# Patient Record
Sex: Male | Born: 1965 | Race: Black or African American | Hispanic: No | Marital: Married | Smoking: Never smoker
Health system: Southern US, Community
[De-identification: ages and names within clinical notes are randomized; demographics above are authoritative.]

## PROBLEM LIST (undated history)

## (undated) DIAGNOSIS — Z86018 Personal history of other benign neoplasm: Secondary | ICD-10-CM

## (undated) DIAGNOSIS — R569 Unspecified convulsions: Secondary | ICD-10-CM

## (undated) DIAGNOSIS — I10 Essential (primary) hypertension: Secondary | ICD-10-CM

## (undated) DIAGNOSIS — E7800 Pure hypercholesterolemia, unspecified: Secondary | ICD-10-CM

## (undated) HISTORY — DX: Personal history of other benign neoplasm: Z86.018

## (undated) HISTORY — DX: Unspecified convulsions: R56.9

## (undated) HISTORY — DX: Pure hypercholesterolemia, unspecified: E78.00

---

## 2017-07-11 ENCOUNTER — Emergency Department (HOSPITAL_COMMUNITY): Payer: Self-pay

## 2017-07-11 ENCOUNTER — Encounter (HOSPITAL_COMMUNITY): Payer: Self-pay | Admitting: Emergency Medicine

## 2017-07-11 ENCOUNTER — Emergency Department (HOSPITAL_COMMUNITY)
Admission: EM | Admit: 2017-07-11 | Discharge: 2017-07-12 | Disposition: A | Discharge status: Home / Self Care | Payer: Self-pay | Attending: Emergency Medicine | Admitting: Emergency Medicine

## 2017-07-11 ENCOUNTER — Other Ambulatory Visit: Payer: Self-pay

## 2017-07-11 DIAGNOSIS — D329 Benign neoplasm of meninges, unspecified: Secondary | ICD-10-CM | POA: Insufficient documentation

## 2017-07-11 DIAGNOSIS — Z7982 Long term (current) use of aspirin: Secondary | ICD-10-CM | POA: Insufficient documentation

## 2017-07-11 DIAGNOSIS — I10 Essential (primary) hypertension: Secondary | ICD-10-CM | POA: Insufficient documentation

## 2017-07-11 DIAGNOSIS — Z79899 Other long term (current) drug therapy: Secondary | ICD-10-CM | POA: Insufficient documentation

## 2017-07-11 HISTORY — DX: Essential (primary) hypertension: I10

## 2017-07-11 LAB — CBG MONITORING, ED: GLUCOSE-CAPILLARY: 109 mg/dL — AB (ref 65–99)

## 2017-07-11 LAB — RAPID URINE DRUG SCREEN, HOSP PERFORMED
Amphetamines: NOT DETECTED
BARBITURATES: NOT DETECTED
Benzodiazepines: NOT DETECTED
Cocaine: NOT DETECTED
Opiates: NOT DETECTED
Tetrahydrocannabinol: NOT DETECTED

## 2017-07-11 LAB — CBC WITH DIFFERENTIAL/PLATELET
BASOS PCT: 1 %
Basophils Absolute: 0 10*3/uL (ref 0.0–0.1)
EOS ABS: 0.2 10*3/uL (ref 0.0–0.7)
Eosinophils Relative: 5 %
HCT: 41.5 % (ref 39.0–52.0)
HEMOGLOBIN: 13.7 g/dL (ref 13.0–17.0)
LYMPHS ABS: 1.7 10*3/uL (ref 0.7–4.0)
Lymphocytes Relative: 52 %
MCH: 28.2 pg (ref 26.0–34.0)
MCHC: 33 g/dL (ref 30.0–36.0)
MCV: 85.6 fL (ref 78.0–100.0)
Monocytes Absolute: 0.3 10*3/uL (ref 0.1–1.0)
Monocytes Relative: 10 %
NEUTROS PCT: 32 %
Neutro Abs: 1 10*3/uL — ABNORMAL LOW (ref 1.7–7.7)
Platelets: 149 10*3/uL — ABNORMAL LOW (ref 150–400)
RBC: 4.85 MIL/uL (ref 4.22–5.81)
RDW: 12.5 % (ref 11.5–15.5)
WBC: 3.2 10*3/uL — ABNORMAL LOW (ref 4.0–10.5)

## 2017-07-11 LAB — I-STAT CHEM 8, ED
BUN: 8 mg/dL (ref 6–20)
Calcium, Ion: 1.16 mmol/L (ref 1.15–1.40)
Chloride: 102 mmol/L (ref 101–111)
Creatinine, Ser: 0.7 mg/dL (ref 0.61–1.24)
Glucose, Bld: 88 mg/dL (ref 65–99)
HEMATOCRIT: 42 % (ref 39.0–52.0)
HEMOGLOBIN: 14.3 g/dL (ref 13.0–17.0)
Potassium: 3.6 mmol/L (ref 3.5–5.1)
SODIUM: 143 mmol/L (ref 135–145)
TCO2: 27 mmol/L (ref 22–32)

## 2017-07-11 LAB — COMPREHENSIVE METABOLIC PANEL
ALBUMIN: 4 g/dL (ref 3.5–5.0)
ALT: 17 U/L (ref 17–63)
ANION GAP: 9 (ref 5–15)
AST: 26 U/L (ref 15–41)
Alkaline Phosphatase: 32 U/L — ABNORMAL LOW (ref 38–126)
BILIRUBIN TOTAL: 0.7 mg/dL (ref 0.3–1.2)
BUN: 8 mg/dL (ref 6–20)
CO2: 27 mmol/L (ref 22–32)
Calcium: 9.5 mg/dL (ref 8.9–10.3)
Chloride: 102 mmol/L (ref 101–111)
Creatinine, Ser: 0.68 mg/dL (ref 0.61–1.24)
GFR calc Af Amer: >60 mL/min (ref >60)
GFR calc non Af Amer: >60 mL/min (ref >60)
GLUCOSE: 91 mg/dL (ref 65–99)
POTASSIUM: 3.5 mmol/L (ref 3.5–5.1)
SODIUM: 138 mmol/L (ref 135–145)
TOTAL PROTEIN: 6.7 g/dL (ref 6.5–8.1)

## 2017-07-11 LAB — SALICYLATE LEVEL: Salicylate Lvl: <7 mg/dL (ref 2.8–30.0)

## 2017-07-11 LAB — I-STAT CG4 LACTIC ACID, ED
LACTIC ACID, VENOUS: 1.14 mmol/L (ref 0.5–1.9)
Lactic Acid, Venous: 2.16 mmol/L (ref 0.5–1.9)

## 2017-07-11 LAB — ETHANOL

## 2017-07-11 LAB — TYPE AND SCREEN
ABO/RH(D): A POS
ANTIBODY SCREEN: NEGATIVE

## 2017-07-11 LAB — ACETAMINOPHEN LEVEL: Acetaminophen (Tylenol), Serum: <10 ug/mL — ABNORMAL LOW (ref 10–30)

## 2017-07-11 LAB — LIPASE, BLOOD: Lipase: 34 U/L (ref 11–51)

## 2017-07-11 LAB — I-STAT TROPONIN, ED: TROPONIN I, POC: 0 ng/mL (ref 0.00–0.08)

## 2017-07-11 LAB — ABO/RH: ABO/RH(D): A POS

## 2017-07-11 MED ORDER — IOPAMIDOL (ISOVUE-300) INJECTION 61%
INTRAVENOUS | Status: AC
  Administered 2017-07-11: 100 mL via INTRAVENOUS
  Filled 2017-07-11: qty 100

## 2017-07-11 MED ORDER — FENTANYL CITRATE (PF) 100 MCG/2ML IJ SOLN
50.0000 ug | Freq: Once | INTRAMUSCULAR | Status: AC
  Administered 2017-07-11: 50 ug via INTRAVENOUS
  Filled 2017-07-11: qty 2

## 2017-07-11 MED ORDER — SODIUM CHLORIDE 0.9 % IV BOLUS (SEPSIS)
1000.0000 mL | Freq: Once | INTRAVENOUS | Status: AC
  Administered 2017-07-11: 1000 mL via INTRAVENOUS

## 2017-07-11 MED ORDER — GADOBENATE DIMEGLUMINE 529 MG/ML IV SOLN
13.0000 mL | Freq: Once | INTRAVENOUS | Status: AC | PRN
  Administered 2017-07-11: 13 mL via INTRAVENOUS
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
## 2017-07-11 NOTE — ED Notes (Signed)
CBG: 109. RN notified.

## 2017-07-11 NOTE — ED Notes (Signed)
Returned from MRI 

## 2017-07-11 NOTE — ED Notes (Signed)
Patient transported to MRI 
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
## 2018-01-01 ENCOUNTER — Other Ambulatory Visit (HOSPITAL_COMMUNITY): Payer: Self-pay | Admitting: Neurosurgery

## 2018-01-01 DIAGNOSIS — D329 Benign neoplasm of meninges, unspecified: Secondary | ICD-10-CM

## 2018-01-18 ENCOUNTER — Ambulatory Visit (HOSPITAL_COMMUNITY): Payer: Self-pay

## 2018-02-11 DIAGNOSIS — Z0289 Encounter for other administrative examinations: Secondary | ICD-10-CM

## 2018-02-12 ENCOUNTER — Emergency Department (HOSPITAL_COMMUNITY)
Admission: EM | Admit: 2018-02-12 | Discharge: 2018-02-12 | Disposition: A | Discharge status: Home / Self Care | Payer: 59 | Attending: Emergency Medicine | Admitting: Emergency Medicine

## 2018-02-12 ENCOUNTER — Other Ambulatory Visit: Payer: Self-pay

## 2018-02-12 ENCOUNTER — Encounter (HOSPITAL_COMMUNITY): Payer: Self-pay

## 2018-02-12 ENCOUNTER — Emergency Department (HOSPITAL_COMMUNITY): Payer: 59

## 2018-02-12 DIAGNOSIS — F0390 Unspecified dementia without behavioral disturbance: Secondary | ICD-10-CM | POA: Diagnosis not present

## 2018-02-12 DIAGNOSIS — R569 Unspecified convulsions: Secondary | ICD-10-CM

## 2018-02-12 DIAGNOSIS — I10 Essential (primary) hypertension: Secondary | ICD-10-CM | POA: Insufficient documentation

## 2018-02-12 DIAGNOSIS — Z79899 Other long term (current) drug therapy: Secondary | ICD-10-CM | POA: Insufficient documentation

## 2018-02-12 HISTORY — DX: Unspecified convulsions: R56.9

## 2018-02-12 LAB — BASIC METABOLIC PANEL
Anion gap: 9 (ref 5–15)
BUN: 7 mg/dL (ref 6–20)
CHLORIDE: 108 mmol/L (ref 98–111)
CO2: 24 mmol/L (ref 22–32)
Calcium: 9.1 mg/dL (ref 8.9–10.3)
Creatinine, Ser: 0.88 mg/dL (ref 0.61–1.24)
GFR calc Af Amer: >60 mL/min (ref >60)
Glucose, Bld: 107 mg/dL — ABNORMAL HIGH (ref 70–99)
POTASSIUM: 4.2 mmol/L (ref 3.5–5.1)
Sodium: 141 mmol/L (ref 135–145)

## 2018-02-12 LAB — CBC
HCT: 42.2 % (ref 39.0–52.0)
HEMOGLOBIN: 13.8 g/dL (ref 13.0–17.0)
MCH: 29.2 pg (ref 26.0–34.0)
MCHC: 32.7 g/dL (ref 30.0–36.0)
MCV: 89.2 fL (ref 78.0–100.0)
Platelets: 161 10*3/uL (ref 150–400)
RBC: 4.73 MIL/uL (ref 4.22–5.81)
RDW: 12.6 % (ref 11.5–15.5)
WBC: 4 10*3/uL (ref 4.0–10.5)

## 2018-02-12 LAB — CBG MONITORING, ED: GLUCOSE-CAPILLARY: 111 mg/dL — AB (ref 70–99)

## 2018-02-12 MED ORDER — LEVETIRACETAM 500 MG PO TABS: 500.0000 mg | ORAL_TABLET | Freq: Two times a day (BID) | ORAL | 1 refills | Status: DC

## 2018-02-12 MED ORDER — PROCHLORPERAZINE EDISYLATE 10 MG/2ML IJ SOLN
10.0000 mg | Freq: Once | INTRAMUSCULAR | Status: AC
Start: 2018-02-12 — End: 2018-02-12
  Administered 2018-02-12: 10 mg via INTRAVENOUS
  Filled 2018-02-12: qty 2

## 2018-02-12 MED ORDER — KETOROLAC TROMETHAMINE 30 MG/ML IJ SOLN
30.0000 mg | Freq: Once | INTRAMUSCULAR | Status: AC
  Administered 2018-02-12: 30 mg via INTRAVENOUS
  Filled 2018-02-12: qty 1

## 2018-02-12 MED ORDER — LEVETIRACETAM IN NACL 1000 MG/100ML IV SOLN
1000.0000 mg | Freq: Once | INTRAVENOUS | Status: AC
  Administered 2018-02-12: 1000 mg via INTRAVENOUS
  Filled 2018-02-12: qty 100

## 2018-02-12 MED ORDER — LORAZEPAM 2 MG/ML IJ SOLN
1.0000 mg | Freq: Once | INTRAMUSCULAR | Status: AC
  Administered 2018-02-12: 1 mg via INTRAVENOUS

## 2018-02-12 MED ORDER — LORAZEPAM 2 MG/ML IJ SOLN
INTRAMUSCULAR | Status: AC
  Filled 2018-02-12: qty 1

## 2018-02-12 NOTE — ED Triage Notes (Signed)
Pt BIB [facility] d/t sz activity. Pt's wife awaken to Pt actively sz in bed. Pt was posturing w/ clinched teeth. Pt activity Pt waS disoriented  & lethargic. Pt has Hx. HTN

## 2018-02-12 NOTE — ED Provider Notes (Signed)
  Physical Exam  BP 101/71   Pulse 87   Temp 97.8 F (36.6 C) (Oral)   Resp 16   Ht 5\' 9"  (1.753 m)   SpO2 98%   BMI 22.15 kg/m   Physical Exam  ED Course/Procedures     Procedures  MDM  Patient with new onset seizure.  Known meningioma stable on CT.  Back near baseline.  Will discharge home.  Neurology follow-up.  No driving.       Davonna Belling, MD 02/12/18 [phone]

## 2018-02-12 NOTE — ED Provider Notes (Signed)
[facility] EMERGENCY DEPARTMENT Provider Note   CSN: [id number] Arrival date & time: 02/12/18  0401     History   Chief Complaint Chief Complaint  Patient presents with  . Seizures   Level 5 caveat: Seizure/postictal   HPI Timothy Conrad is a 52 y.o. male.  HPI Patient is a 52 year old male with history of hypertension was brought to the emergency department after his wife was awoken as he was having a seizure in the bed.  No prior history of seizures.  He does have a history of known meningioma.  This is been determined by neurosurgical to be nonoperative at this point.  He has been eating and drinking normally per the wife and has had no recent illness or fevers.  On arrival the emergency department he has had a recurrent second seizure.  No other history is able to be obtained at this time   Past Medical History:  Diagnosis Date  . High blood pressure     There are no active problems to display for this patient.   History reviewed. No pertinent surgical history.      Home Medications    Prior to Admission medications   Medication Sig Start Date End Date Taking? Authorizing Provider  Multiple Vitamin (MULTIVITAMIN WITH MINERALS) TABS tablet Take 1 tablet by mouth daily.   Yes [provider]  levETIRAcetam (KEPPRA) 500 MG tablet Take 1 tablet (500 mg total) by mouth 2 (two) times daily. 02/12/18   Jola Schmidt, MD    Family History History reviewed. No pertinent family history.  Social History Social History   Tobacco Use  . Smoking status: Never Smoker  . Smokeless tobacco: Never Used  Substance Use Topics  . Alcohol use: No    Frequency: Never  . Drug use: No     Allergies   Patient has no known allergies.   Review of Systems Review of Systems  Unable to perform ROS: Dementia     Physical Exam Updated Vital Signs BP 107/74   Pulse 97   Temp 97.8 F (36.6 C) (Oral)   Resp 15   Ht 5\' 9"  (1.753 m)   SpO2  98%   BMI 22.15 kg/m   Physical Exam  Constitutional: He appears well-developed and well-nourished.  HENT:  Head: Normocephalic and atraumatic.  Eyes: Pupils are equal, round, and reactive to light. EOM are normal.  Neck: Neck supple.  Cardiovascular: Regular rhythm.  Pulmonary/Chest: Effort normal and breath sounds normal. No respiratory distress.  Abdominal: Soft. He exhibits no distension. There is no tenderness.  Musculoskeletal:  Moves all 4 extremities  Neurological:  Opens eyes to pain  Skin: Skin is warm and dry.  Nursing note and vitals reviewed.    ED Treatments / Results  Labs (all labs ordered are listed, but only abnormal results are displayed) Labs Reviewed  CBG MONITORING, ED - Abnormal; Notable for the following components:      Result Value   Glucose-Capillary 111 (*)    All other components within normal limits  CBC  BASIC METABOLIC PANEL    EKG None  Radiology Ct Head Wo Contrast  Result Date: 02/12/2018 CLINICAL DATA:  Acute onset of seizures. EXAM: CT HEAD WITHOUT CONTRAST TECHNIQUE: Contiguous axial images were obtained from the base of the skull through the vertex without intravenous contrast. COMPARISON:  MRI of the brain and CT of the head performed 07/11/2017 FINDINGS: Brain: A 1.7 cm calcified meningioma is again noted at the  left anterior parafalcine region, with overlying edema, relatively stable from the prior study. No evidence of acute infarction, hemorrhage, hydrocephalus, or extra-axial collection. The posterior fossa, including the cerebellum, brainstem and fourth ventricle, is within normal limits. The third and lateral ventricles, and basal ganglia are unremarkable in appearance. The cerebral hemispheres are symmetric in appearance, with normal gray-white differentiation. No midline shift is seen. Vascular: No hyperdense vessel or unexpected calcification. Skull: There is no evidence of fracture; visualized osseous structures are  unremarkable in appearance. Sinuses/Orbits: The visualized portions of the orbits are within normal limits. The paranasal sinuses and mastoid air cells are well-aerated. Other: No significant soft tissue abnormalities are seen. IMPRESSION: 1. No acute intracranial pathology seen on CT. 2. 1.7 cm calcified meningioma again noted at the left anterior parafalcine region, with overlying edema, relatively stable from the prior study. Electronically Signed   By: Garald Balding M.D.   On: 02/12/2018 04:52    Procedures Procedures (including critical care time)  Medications Ordered in ED Medications  levETIRAcetam (KEPPRA) IVPB 1000 mg/100 mL premix (0 mg Intravenous Stopped 02/12/18 0548)  LORazepam (ATIVAN) injection 1 mg (1 mg Intravenous Given 02/12/18 0417)  prochlorperazine (COMPAZINE) injection 10 mg (10 mg Intravenous Given 02/12/18 0624)  ketorolac (TORADOL) 30 MG/ML injection 30 mg (30 mg Intravenous Given 02/12/18 0370)     Initial Impression / Assessment and Plan / ED Course  I have reviewed the triage vital signs and the nursing notes.  Pertinent labs & imaging results that were available during my care of the patient were reviewed by me and considered in my medical decision making (see chart for details).     Patient loaded with Keppra here in the emergency department.  He received a dose of Ativan to stop his seizure.  Head CT demonstrates no acute intracranial abnormality.  Known meningioma is similar in size.  Family updated.  Patient still postictal.  7:09 AM Patient opens eyes to voice and pain.  Rolls over and goes back to sleep.  Likely still reactive to Ativan.  Patient will need reevaluation to make sure he returns to baseline mental status.  As long as he returned to baseline mental status he will be stable for discharge with outpatient neurology follow-up.  Given his seizure x2 adenitis and has had a restarted on Keppra. 7:47 AM Care to Dr Alvino Chapel  Final Clinical  Impressions(s) / ED Diagnoses   Final diagnoses:  Seizure [facility])    ED Discharge Orders         Ordered    levETIRAcetam (KEPPRA) 500 MG tablet  2 times daily,   Status:  Discontinued     02/12/18 0705    levETIRAcetam (KEPPRA) 500 MG tablet  2 times daily     02/12/18 0705           Jola Schmidt, MD 02/12/18 [id number]

## 2018-02-17 ENCOUNTER — Ambulatory Visit (INDEPENDENT_AMBULATORY_CARE_PROVIDER_SITE_OTHER): Payer: 59 | Admitting: Diagnostic Neuroimaging

## 2018-02-17 ENCOUNTER — Encounter: Payer: Self-pay | Admitting: Diagnostic Neuroimaging

## 2018-02-17 ENCOUNTER — Encounter: Payer: Self-pay | Admitting: *Deleted

## 2018-02-17 VITALS — BP 129/79 | HR 89 | Ht 68.0 in | Wt 150.2 lb

## 2018-02-17 DIAGNOSIS — D329 Benign neoplasm of meninges, unspecified: Secondary | ICD-10-CM

## 2018-02-17 DIAGNOSIS — G40909 Epilepsy, unspecified, not intractable, without status epilepticus: Secondary | ICD-10-CM | POA: Insufficient documentation

## 2018-02-17 MED ORDER — LEVETIRACETAM 500 MG PO TABS: 500.0000 mg | ORAL_TABLET | Freq: Two times a day (BID) | ORAL | 4 refills | Status: DC
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
## 2018-02-17 NOTE — Patient Instructions (Signed)
SEIZURE DISORDER  - continue levetiracetam 500mg  twice a day  - check EEG  - follow up with neurosurgery clinic for treatment of meningioma (likely symptomatic)   - According to [place] law, you can not drive unless you are seizure / syncope free for at least 6 months and under physician's care.   - Please maintain precautions. Do not participate in activities where a loss of awareness could harm you or someone else. No swimming alone, no tub bathing, no hot tubs, no driving, no operating motorized vehicles (cars, ATVs, motocycles, etc), lawnmowers, power tools or firearms. No standing at heights, such as rooftops, ladders or stairs. Avoid hot objects such as stoves, heaters, open fires. Wear a helmet when riding a bicycle, scooter, skateboard, etc. and avoid areas of traffic. Set your water heater to 120 degrees or less.

## 2018-02-19 ENCOUNTER — Telehealth: Payer: Self-pay | Admitting: *Deleted

## 2018-02-19 NOTE — Telephone Encounter (Signed)
Received short term disability forms from [place]. Form placed on Dr [employer] for review, completion, signature.

## 2018-02-22 NOTE — Telephone Encounter (Signed)
[employer] short term disability forms completed, sent to MR with copy of last office note to be processed.

## 2018-02-23 ENCOUNTER — Telehealth: Payer: Self-pay | Admitting: *Deleted

## 2018-02-23 NOTE — Telephone Encounter (Signed)
I faxed pt Timothy Conrad group form to [phone]

## 2018-02-25 ENCOUNTER — Ambulatory Visit (INDEPENDENT_AMBULATORY_CARE_PROVIDER_SITE_OTHER): Payer: 59 | Admitting: Diagnostic Neuroimaging

## 2018-02-25 DIAGNOSIS — G40909 Epilepsy, unspecified, not intractable, without status epilepticus: Secondary | ICD-10-CM | POA: Diagnosis not present

## 2018-03-02 DIAGNOSIS — Z0289 Encounter for other administrative examinations: Secondary | ICD-10-CM

## 2018-03-02 NOTE — Telephone Encounter (Signed)
Received second request for [employer] forms and office notes. They were faxed on 02/23/18 per D Settle, MR. Re faxed previous forms and office note again to [employer]. Received confirmation of successful fax.

## 2018-03-10 ENCOUNTER — Telehealth: Payer: Self-pay | Admitting: *Deleted

## 2018-03-10 DIAGNOSIS — Z0289 Encounter for other administrative examinations: Secondary | ICD-10-CM

## 2018-03-10 NOTE — Telephone Encounter (Signed)
I spoke to pt about his accomodation form.  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  Has appt tomorrow for his NS appt.  Maureen in [place] [phone].

## 2018-03-11 ENCOUNTER — Telehealth: Payer: Self-pay | Admitting: *Deleted

## 2018-03-11 NOTE — Telephone Encounter (Signed)
I spoke to [place] this AM about the LOA form, ? If this is for the no driving for 6 months after seizure.  She stated yes.  I completed and given to Dr. Leta Baptist for review and signature.

## 2018-03-11 NOTE — Procedures (Signed)
   [facility]  EEG (ELECTROENCEPHALOGRAM) REPORT   STUDY DATE: 02/25/18 PATIENT NAME: Timothy Conrad DOB: December 27, 1965 MRN: [id number]  ORDERING CLINICIAN: Andrey Spearman, MD   TECHNOLOGIST: Arelia Longest  TECHNIQUE: Electroencephalogram was recorded utilizing standard 10-20 system of lead placement and reformatted into average and bipolar montages.  RECORDING TIME: 25 minutes ACTIVATION: hyperventilation and photic stimulation  CLINICAL INFORMATION: 52 year old male with seizure.  FINDINGS: Posterior dominant background rhythms, which attenuate with eye opening, ranging 10-11 hertz and 30-40 microvolts. No focal, lateralizing, epileptiform activity or seizures are seen. Patient recorded in the awake and drowsy state. EKG channel shows regular rhythm of 70-75 beats per minute.   IMPRESSION:   Normal EEG in the awake and drowsy states.    INTERPRETING PHYSICIAN:  Penni Bombard, MD Certified in Neurology, Neurophysiology and Neuroimaging  [facility] [address], [place], [place] [address] [phone]

## 2018-03-11 NOTE — Telephone Encounter (Signed)
eeg is normal. -VRP

## 2018-03-11 NOTE — Telephone Encounter (Signed)
Pt called need EEG result. Please call [phone]

## 2018-03-12 NOTE — Telephone Encounter (Signed)
Spoke to pt and relayed that the EEG was normal.  He is to continue no driving for 6 months from last seizure.  Take his keppra 500mg  po BID as ordered.   Refills are good for up to a year.  He verbalized understanding.  He is to call if any other questions.

## 2018-03-15 ENCOUNTER — Telehealth: Payer: Self-pay

## 2018-03-15 NOTE — Telephone Encounter (Signed)
Spoke with the patient and he verbalized understanding his results. No questions at this time.

## 2018-03-15 NOTE — Telephone Encounter (Signed)
-----   Message from Penni Bombard, MD sent at 03/11/2018  6:54 PM EDT ----- Normal EEG. Please call patient. Continue current plan. -VRP

## 2018-03-29 NOTE — Telephone Encounter (Signed)
LMVM for pt on mobile, that per [place] LAW no driving for 6 months from last sz.  (we follow the laws recommendation) as well.  He may call if questions.

## 2018-03-29 NOTE — Telephone Encounter (Signed)
Pt requesting a call back stating Dr. Leta Baptist stated he could drive to work if it's not too far away. Pt wanting to know his "limit" on how far he can go

## 2018-04-15 DIAGNOSIS — Z0289 Encounter for other administrative examinations: Secondary | ICD-10-CM

## 2018-04-20 ENCOUNTER — Telehealth: Payer: Self-pay | Admitting: *Deleted

## 2018-04-20 NOTE — Telephone Encounter (Signed)
Received documents from [employer]. Section D: attending physician's statement completde, placed on Dr [facility] desk for review, signature.

## 2018-04-22 ENCOUNTER — Telehealth: Payer: Self-pay | Admitting: *Deleted

## 2018-04-22 NOTE — Telephone Encounter (Signed)
I faxed pt Timothy Conrad form to [phone]

## 2018-04-22 NOTE — Telephone Encounter (Signed)
[facility] attending physician statement completed, signed, sent to MR for processing.

## 2018-05-03 ENCOUNTER — Telehealth: Payer: Self-pay | Admitting: Diagnostic Neuroimaging

## 2018-05-03 NOTE — Telephone Encounter (Signed)
Patient calling to discuss if he should continue taking levETIRAcetam (KEPPRA) 500 MG tablet.

## 2018-05-03 NOTE — Telephone Encounter (Signed)
Spoke with patient and advised him per Dr Leta Baptist to continue taking seizure medication as ordered. Reminded him of his 6 month FU; he asked for time to arrive; this RN advised. He verbalized understanding, appreciation.

## 2018-08-24 ENCOUNTER — Encounter: Payer: Self-pay | Admitting: Diagnostic Neuroimaging

## 2018-08-24 ENCOUNTER — Ambulatory Visit: Payer: BLUE CROSS/BLUE SHIELD | Admitting: Diagnostic Neuroimaging

## 2018-08-24 VITALS — BP 150/84 | HR 100 | Ht 68.0 in | Wt 149.6 lb

## 2018-08-24 DIAGNOSIS — D329 Benign neoplasm of meninges, unspecified: Secondary | ICD-10-CM | POA: Diagnosis not present

## 2018-08-24 DIAGNOSIS — G40909 Epilepsy, unspecified, not intractable, without status epilepticus: Secondary | ICD-10-CM

## 2018-08-24 DIAGNOSIS — R5383 Other fatigue: Secondary | ICD-10-CM

## 2018-08-24 DIAGNOSIS — R0683 Snoring: Secondary | ICD-10-CM

## 2018-08-24 MED ORDER — LEVETIRACETAM 500 MG PO TABS: 500.0000 mg | ORAL_TABLET | Freq: Two times a day (BID) | ORAL | 4 refills | Status: AC

## 2018-08-24 NOTE — Patient Instructions (Addendum)
SEIZURE DISORDER (last seizures 02/12/18) - continue levetiracetam 500mg  twice a day - repeat MRI brain w/wo - follow up with neurosurgery clinic for treatment of meningioma (likely symptomatic) - ok to return to driving  INSOMNIA / FATIGUE / SNORING - sleep study - melatonin and sleep hygiene reviewed
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
## 2018-08-25 ENCOUNTER — Telehealth: Payer: Self-pay | Admitting: Diagnostic Neuroimaging

## 2018-08-25 NOTE — Telephone Encounter (Signed)
[employer] Auth: [id number] (exp. 08/25/18 to 09/23/18) order faxed to [facility] bc that is who is in network with his insurance. I spoke to the patient and he is aware of this.

## 2019-02-22 ENCOUNTER — Ambulatory Visit: Payer: BLUE CROSS/BLUE SHIELD | Admitting: Diagnostic Neuroimaging
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
